# Patient Record
Sex: Male | Born: 1945 | Race: White | Hispanic: No | Marital: Married
Health system: Southern US, Community
[De-identification: ages and names within clinical notes are randomized; demographics above are authoritative.]

---

## 2006-07-30 ENCOUNTER — Other Ambulatory Visit: Admission: RE | Admit: 2006-07-30 | Discharge: 2006-07-30 | Payer: Self-pay | Admitting: Otolaryngology

## 2006-08-09 ENCOUNTER — Ambulatory Visit (HOSPITAL_BASED_OUTPATIENT_CLINIC_OR_DEPARTMENT_OTHER): Admission: RE | Admit: 2006-08-09 | Discharge: 2006-08-09 | Payer: Self-pay | Admitting: Otolaryngology

## 2006-08-09 ENCOUNTER — Encounter (INDEPENDENT_AMBULATORY_CARE_PROVIDER_SITE_OTHER): Payer: Self-pay | Admitting: Otolaryngology

## 2006-08-09 ENCOUNTER — Encounter (INDEPENDENT_AMBULATORY_CARE_PROVIDER_SITE_OTHER): Payer: Self-pay | Admitting: Specialist

## 2006-08-15 ENCOUNTER — Ambulatory Visit: Payer: Self-pay | Admitting: Hematology & Oncology

## 2006-08-30 LAB — CBC WITH DIFFERENTIAL/PLATELET
Basophils Absolute: 0.1 10*3/uL (ref 0.0–0.1)
HCT: 41.9 % (ref 38.7–49.9)
HGB: 14.5 g/dL (ref 13.0–17.1)
MONO#: 0.6 10*3/uL (ref 0.1–0.9)
NEUT%: 62.9 % (ref 40.0–75.0)
WBC: 6.6 10*3/uL (ref 4.0–10.0)
lymph#: 1.7 10*3/uL (ref 0.9–3.3)

## 2006-09-03 LAB — PROTEIN ELECTROPHORESIS, SERUM
Alpha-2-Globulin: 7.5 % (ref 7.1–11.8)
Gamma Globulin: 15.5 % (ref 11.1–18.8)

## 2006-09-03 LAB — COMPREHENSIVE METABOLIC PANEL
ALT: 18 U/L (ref 0–53)
BUN: 22 mg/dL (ref 6–23)
CO2: 27 mEq/L (ref 19–32)
Calcium: 10.5 mg/dL (ref 8.4–10.5)
Chloride: 102 mEq/L (ref 96–112)
Creatinine, Ser: 1.26 mg/dL (ref 0.40–1.50)
Glucose, Bld: 84 mg/dL (ref 70–99)

## 2006-09-03 LAB — IGG, IGA, IGM
IgA: 72 mg/dL (ref 68–378)
IgM, Serum: 45 mg/dL — ABNORMAL LOW (ref 60–263)

## 2006-09-03 LAB — LACTATE DEHYDROGENASE: LDH: 128 U/L (ref 94–250)

## 2006-09-16 ENCOUNTER — Encounter (INDEPENDENT_AMBULATORY_CARE_PROVIDER_SITE_OTHER): Payer: Self-pay | Admitting: *Deleted

## 2006-09-16 ENCOUNTER — Ambulatory Visit (HOSPITAL_COMMUNITY): Admission: RE | Admit: 2006-09-16 | Discharge: 2006-09-16 | Payer: Self-pay | Admitting: Hematology & Oncology

## 2006-09-16 ENCOUNTER — Encounter: Payer: Self-pay | Admitting: Hematology & Oncology

## 2006-09-19 ENCOUNTER — Ambulatory Visit: Payer: Self-pay | Admitting: Hematology & Oncology

## 2006-09-19 ENCOUNTER — Ambulatory Visit (HOSPITAL_COMMUNITY): Admission: RE | Admit: 2006-09-19 | Discharge: 2006-09-19 | Payer: Self-pay | Admitting: Hematology & Oncology

## 2006-10-03 ENCOUNTER — Ambulatory Visit: Payer: Self-pay | Admitting: Hematology & Oncology

## 2006-11-04 LAB — COMPREHENSIVE METABOLIC PANEL
ALT: 27 U/L (ref 0–53)
Alkaline Phosphatase: 80 U/L (ref 39–117)
Sodium: 139 mEq/L (ref 135–145)
Total Bilirubin: 1.1 mg/dL (ref 0.3–1.2)
Total Protein: 7.5 g/dL (ref 6.0–8.3)

## 2006-11-04 LAB — CBC WITH DIFFERENTIAL/PLATELET
BASO%: 2.6 % — ABNORMAL HIGH (ref 0.0–2.0)
EOS%: 3.9 % (ref 0.0–7.0)
LYMPH%: 16.5 % (ref 14.0–48.0)
MCH: 30.8 pg (ref 28.0–33.4)
MCHC: 36.4 g/dL — ABNORMAL HIGH (ref 32.0–35.9)
MCV: 84.5 fL (ref 81.6–98.0)
MONO%: 20 % — ABNORMAL HIGH (ref 0.0–13.0)
Platelets: 209 10*3/uL (ref 145–400)
RBC: 4.84 10*6/uL (ref 4.20–5.71)
WBC: 4 10*3/uL (ref 4.0–10.0)

## 2006-11-25 ENCOUNTER — Ambulatory Visit (HOSPITAL_COMMUNITY): Admission: RE | Admit: 2006-11-25 | Discharge: 2006-11-25 | Payer: Self-pay | Admitting: Hematology & Oncology

## 2006-11-27 ENCOUNTER — Ambulatory Visit: Payer: Self-pay | Admitting: Hematology & Oncology

## 2006-12-11 LAB — CBC WITH DIFFERENTIAL/PLATELET
BASO%: 0.8 % (ref 0.0–2.0)
Basophils Absolute: 0 10*3/uL (ref 0.0–0.1)
EOS%: 1.4 % (ref 0.0–7.0)
HCT: 38.6 % — ABNORMAL LOW (ref 38.7–49.9)
HGB: 13.9 g/dL (ref 13.0–17.1)
LYMPH%: 49.2 % — ABNORMAL HIGH (ref 14.0–48.0)
MCH: 31.6 pg (ref 28.0–33.4)
MCHC: 35.9 g/dL (ref 32.0–35.9)
MONO#: 1 10*3/uL — ABNORMAL HIGH (ref 0.1–0.9)
NEUT%: 30.1 % — ABNORMAL LOW (ref 40.0–75.0)
Platelets: 204 10*3/uL (ref 145–400)

## 2007-01-01 ENCOUNTER — Ambulatory Visit: Payer: Self-pay | Admitting: Hematology & Oncology

## 2007-01-01 LAB — CBC WITH DIFFERENTIAL/PLATELET
Basophils Absolute: 0 10*3/uL (ref 0.0–0.1)
EOS%: 2 % (ref 0.0–7.0)
Eosinophils Absolute: 0.1 10*3/uL (ref 0.0–0.5)
LYMPH%: 24.9 % (ref 14.0–48.0)
MCH: 31.4 pg (ref 28.0–33.4)
MCV: 87.8 fL (ref 81.6–98.0)
MONO%: 19.4 % — ABNORMAL HIGH (ref 0.0–13.0)
NEUT#: 2.2 10*3/uL (ref 1.5–6.5)
Platelets: 147 10*3/uL (ref 145–400)
RBC: 4.23 10*6/uL (ref 4.20–5.71)
RDW: 15.6 % — ABNORMAL HIGH (ref 11.2–14.6)

## 2007-01-28 LAB — CBC WITH DIFFERENTIAL/PLATELET
BASO%: 1.3 % (ref 0.0–2.0)
LYMPH%: 12.8 % — ABNORMAL LOW (ref 14.0–48.0)
MCHC: 36.2 g/dL — ABNORMAL HIGH (ref 32.0–35.9)
MCV: 88.8 fL (ref 81.6–98.0)
MONO%: 19.3 % — ABNORMAL HIGH (ref 0.0–13.0)
NEUT#: 2.4 10*3/uL (ref 1.5–6.5)
Platelets: 156 10*3/uL (ref 145–400)
RBC: 4.3 10*6/uL (ref 4.20–5.71)
RDW: 15.8 % — ABNORMAL HIGH (ref 11.2–14.6)
WBC: 3.8 10*3/uL — ABNORMAL LOW (ref 4.0–10.0)

## 2007-01-28 LAB — COMPREHENSIVE METABOLIC PANEL
ALT: 32 U/L (ref 0–53)
AST: 24 U/L (ref 0–37)
Albumin: 4.5 g/dL (ref 3.5–5.2)
Alkaline Phosphatase: 80 U/L (ref 39–117)
Potassium: 4.6 mEq/L (ref 3.5–5.3)
Sodium: 141 mEq/L (ref 135–145)
Total Bilirubin: 0.8 mg/dL (ref 0.3–1.2)
Total Protein: 7.1 g/dL (ref 6.0–8.3)

## 2007-02-21 ENCOUNTER — Ambulatory Visit: Payer: Self-pay | Admitting: Hematology & Oncology

## 2007-02-25 LAB — COMPREHENSIVE METABOLIC PANEL
AST: 20 U/L (ref 0–37)
Alkaline Phosphatase: 69 U/L (ref 39–117)
Glucose, Bld: 94 mg/dL (ref 70–99)
Sodium: 138 mEq/L (ref 135–145)
Total Bilirubin: 0.6 mg/dL (ref 0.3–1.2)
Total Protein: 7 g/dL (ref 6.0–8.3)

## 2007-02-25 LAB — CBC WITH DIFFERENTIAL/PLATELET
BASO%: 1.5 % (ref 0.0–2.0)
EOS%: 2 % (ref 0.0–7.0)
Eosinophils Absolute: 0.1 10*3/uL (ref 0.0–0.5)
LYMPH%: 30.5 % (ref 14.0–48.0)
MCH: 31.3 pg (ref 28.0–33.4)
MCHC: 36.2 g/dL — ABNORMAL HIGH (ref 32.0–35.9)
MCV: 86.3 fL (ref 81.6–98.0)
MONO%: 20.5 % — ABNORMAL HIGH (ref 0.0–13.0)
Platelets: 141 10*3/uL — ABNORMAL LOW (ref 145–400)
RBC: 4.19 10*6/uL — ABNORMAL LOW (ref 4.20–5.71)
RDW: 13.1 % (ref 11.2–14.6)

## 2007-04-01 ENCOUNTER — Encounter: Payer: Self-pay | Admitting: Hematology & Oncology

## 2007-04-01 ENCOUNTER — Ambulatory Visit: Payer: Self-pay | Admitting: Hematology & Oncology

## 2007-04-01 ENCOUNTER — Ambulatory Visit (HOSPITAL_COMMUNITY): Admission: RE | Admit: 2007-04-01 | Discharge: 2007-04-01 | Payer: Self-pay | Admitting: Hematology & Oncology

## 2007-04-07 ENCOUNTER — Ambulatory Visit: Payer: Self-pay | Admitting: Hematology & Oncology

## 2007-04-08 ENCOUNTER — Ambulatory Visit (HOSPITAL_COMMUNITY): Admission: RE | Admit: 2007-04-08 | Discharge: 2007-04-08 | Payer: Self-pay | Admitting: Hematology & Oncology

## 2007-04-09 LAB — CBC WITH DIFFERENTIAL/PLATELET
Basophils Absolute: 0 10*3/uL (ref 0.0–0.1)
EOS%: 2.1 % (ref 0.0–7.0)
Eosinophils Absolute: 0.1 10*3/uL (ref 0.0–0.5)
HCT: 33.7 % — ABNORMAL LOW (ref 38.7–49.9)
HGB: 12.2 g/dL — ABNORMAL LOW (ref 13.0–17.1)
MCH: 33.2 pg (ref 28.0–33.4)
MCV: 92.2 fL (ref 81.6–98.0)
NEUT#: 1.6 10*3/uL (ref 1.5–6.5)
NEUT%: 47.5 % (ref 40.0–75.0)
lymph#: 1.1 10*3/uL (ref 0.9–3.3)

## 2007-04-09 LAB — COMPREHENSIVE METABOLIC PANEL
AST: 17 U/L (ref 0–37)
Albumin: 4.4 g/dL (ref 3.5–5.2)
BUN: 24 mg/dL — ABNORMAL HIGH (ref 6–23)
Calcium: 9.9 mg/dL (ref 8.4–10.5)
Chloride: 104 mEq/L (ref 96–112)
Creatinine, Ser: 1.24 mg/dL (ref 0.40–1.50)
Glucose, Bld: 145 mg/dL — ABNORMAL HIGH (ref 70–99)
Potassium: 4.8 mEq/L (ref 3.5–5.3)

## 2007-04-09 LAB — LACTATE DEHYDROGENASE: LDH: 167 U/L (ref 94–250)

## 2007-06-06 ENCOUNTER — Ambulatory Visit: Payer: Self-pay | Admitting: Hematology & Oncology

## 2007-06-10 LAB — COMPREHENSIVE METABOLIC PANEL
AST: 18 U/L (ref 0–37)
Albumin: 4.2 g/dL (ref 3.5–5.2)
Alkaline Phosphatase: 74 U/L (ref 39–117)
Potassium: 4.4 mEq/L (ref 3.5–5.3)
Sodium: 140 mEq/L (ref 135–145)
Total Bilirubin: 0.8 mg/dL (ref 0.3–1.2)
Total Protein: 6.6 g/dL (ref 6.0–8.3)

## 2007-06-10 LAB — CBC WITH DIFFERENTIAL/PLATELET
Basophils Absolute: 0 10*3/uL (ref 0.0–0.1)
Eosinophils Absolute: 0.1 10*3/uL (ref 0.0–0.5)
HGB: 11 g/dL — ABNORMAL LOW (ref 13.0–17.1)
MCV: 94.6 fL (ref 81.6–98.0)
MONO#: 0.4 10*3/uL (ref 0.1–0.9)
MONO%: 15.8 % — ABNORMAL HIGH (ref 0.0–13.0)
NEUT#: 1.4 10*3/uL — ABNORMAL LOW (ref 1.5–6.5)
RBC: 3.21 10*6/uL — ABNORMAL LOW (ref 4.20–5.71)
RDW: 16 % — ABNORMAL HIGH (ref 11.2–14.6)
WBC: 2.5 10*3/uL — ABNORMAL LOW (ref 4.0–10.0)
lymph#: 0.6 10*3/uL — ABNORMAL LOW (ref 0.9–3.3)

## 2007-07-31 ENCOUNTER — Ambulatory Visit (HOSPITAL_COMMUNITY): Admission: RE | Admit: 2007-07-31 | Discharge: 2007-07-31 | Payer: Self-pay | Admitting: Hematology & Oncology

## 2007-08-01 ENCOUNTER — Ambulatory Visit: Payer: Self-pay | Admitting: Hematology & Oncology

## 2007-08-05 LAB — COMPREHENSIVE METABOLIC PANEL
AST: 16 U/L (ref 0–37)
BUN: 18 mg/dL (ref 6–23)
CO2: 26 mEq/L (ref 19–32)
Calcium: 10.1 mg/dL (ref 8.4–10.5)
Chloride: 104 mEq/L (ref 96–112)
Creatinine, Ser: 1.19 mg/dL (ref 0.40–1.50)

## 2007-08-05 LAB — CBC & DIFF AND RETIC
BASO%: 1.2 % (ref 0.0–2.0)
Basophils Absolute: 0 10*3/uL (ref 0.0–0.1)
EOS%: 3.6 % (ref 0.0–7.0)
Eosinophils Absolute: 0.1 10*3/uL (ref 0.0–0.5)
HCT: 31.3 % — ABNORMAL LOW (ref 38.7–49.9)
HGB: 11.2 g/dL — ABNORMAL LOW (ref 13.0–17.1)
IRF: 0.42 — ABNORMAL HIGH (ref 0.070–0.380)
LYMPH%: 28.5 % (ref 14.0–48.0)
MCH: 34.5 pg — ABNORMAL HIGH (ref 28.0–33.4)
MCHC: 36 g/dL — ABNORMAL HIGH (ref 32.0–35.9)
MCV: 96 fL (ref 81.6–98.0)
MONO#: 0.7 10*3/uL (ref 0.1–0.9)
MONO%: 19.9 % — ABNORMAL HIGH (ref 0.0–13.0)
NEUT#: 1.6 10*3/uL (ref 1.5–6.5)
NEUT%: 46.8 % (ref 40.0–75.0)
Platelets: 77 10*3/uL — ABNORMAL LOW (ref 145–400)
RBC: 3.26 10*6/uL — ABNORMAL LOW (ref 4.20–5.71)
RDW: 14.5 % (ref 11.2–14.6)
RETIC #: 85.1 10*3/uL (ref 31.8–103.9)
Retic %: 2.6 % — ABNORMAL HIGH (ref 0.7–2.3)
WBC: 3.5 10*3/uL — ABNORMAL LOW (ref 4.0–10.0)
lymph#: 1 10*3/uL (ref 0.9–3.3)

## 2007-08-05 LAB — LACTATE DEHYDROGENASE: LDH: 182 U/L (ref 94–250)

## 2007-08-05 LAB — CHCC SMEAR

## 2007-10-20 ENCOUNTER — Ambulatory Visit (HOSPITAL_COMMUNITY): Admission: RE | Admit: 2007-10-20 | Discharge: 2007-10-20 | Payer: Self-pay | Admitting: Hematology & Oncology

## 2007-10-30 ENCOUNTER — Ambulatory Visit: Payer: Self-pay | Admitting: Hematology & Oncology

## 2007-11-04 LAB — CBC WITH DIFFERENTIAL/PLATELET
BASO%: 2.4 % — ABNORMAL HIGH (ref 0.0–2.0)
Eosinophils Absolute: 0.1 10*3/uL (ref 0.0–0.5)
LYMPH%: 26.8 % (ref 14.0–48.0)
MCHC: 36 g/dL — ABNORMAL HIGH (ref 32.0–35.9)
MONO#: 0.5 10*3/uL (ref 0.1–0.9)
NEUT#: 2.5 10*3/uL (ref 1.5–6.5)
Platelets: 77 10*3/uL — ABNORMAL LOW (ref 145–400)
RBC: 3.83 10*6/uL — ABNORMAL LOW (ref 4.20–5.71)
RDW: 13.3 % (ref 11.2–14.6)
WBC: 4.4 10*3/uL (ref 4.0–10.0)
lymph#: 1.2 10*3/uL (ref 0.9–3.3)

## 2007-11-04 LAB — COMPREHENSIVE METABOLIC PANEL
ALT: 17 U/L (ref 0–53)
CO2: 26 mEq/L (ref 19–32)
Calcium: 9.8 mg/dL (ref 8.4–10.5)
Chloride: 106 mEq/L (ref 96–112)
Glucose, Bld: 87 mg/dL (ref 70–99)
Sodium: 138 mEq/L (ref 135–145)
Total Bilirubin: 0.9 mg/dL (ref 0.3–1.2)
Total Protein: 6.5 g/dL (ref 6.0–8.3)

## 2007-11-04 LAB — LACTATE DEHYDROGENASE: LDH: 152 U/L (ref 94–250)

## 2007-11-04 LAB — CHCC SMEAR

## 2008-01-30 ENCOUNTER — Ambulatory Visit: Payer: Self-pay | Admitting: Hematology & Oncology

## 2008-02-03 ENCOUNTER — Ambulatory Visit: Payer: Self-pay | Admitting: Hematology & Oncology

## 2008-02-04 LAB — CBC WITH DIFFERENTIAL (CANCER CENTER ONLY)
BASO#: 0 10*3/uL (ref 0.0–0.2)
Eosinophils Absolute: 0.1 10*3/uL (ref 0.0–0.5)
HGB: 13.2 g/dL (ref 13.0–17.1)
LYMPH#: 1.1 10*3/uL (ref 0.9–3.3)
MCH: 32.9 pg (ref 28.0–33.4)
MONO%: 15.5 % — ABNORMAL HIGH (ref 0.0–13.0)
NEUT#: 1.8 10*3/uL (ref 1.5–6.5)
Platelets: 90 10*3/uL — ABNORMAL LOW (ref 145–400)
RBC: 4.01 10*6/uL — ABNORMAL LOW (ref 4.20–5.70)
WBC: 3.6 10*3/uL — ABNORMAL LOW (ref 4.0–10.0)

## 2008-02-04 LAB — COMPREHENSIVE METABOLIC PANEL
Albumin: 4.2 g/dL (ref 3.5–5.2)
BUN: 21 mg/dL (ref 6–23)
CO2: 22 mEq/L (ref 19–32)
Calcium: 9.9 mg/dL (ref 8.4–10.5)
Chloride: 105 mEq/L (ref 96–112)
Creatinine, Ser: 1.18 mg/dL (ref 0.40–1.50)
Glucose, Bld: 109 mg/dL — ABNORMAL HIGH (ref 70–99)
Potassium: 4.6 mEq/L (ref 3.5–5.3)

## 2008-02-04 LAB — LACTATE DEHYDROGENASE: LDH: 194 U/L (ref 94–250)

## 2008-02-04 LAB — CHCC SATELLITE - SMEAR

## 2008-04-07 ENCOUNTER — Ambulatory Visit (HOSPITAL_COMMUNITY): Admission: RE | Admit: 2008-04-07 | Discharge: 2008-04-07 | Payer: Self-pay | Admitting: Hematology & Oncology

## 2008-05-04 ENCOUNTER — Ambulatory Visit: Payer: Self-pay | Admitting: Hematology & Oncology

## 2008-05-05 LAB — COMPREHENSIVE METABOLIC PANEL
ALT: 22 U/L (ref 0–53)
Alkaline Phosphatase: 68 U/L (ref 39–117)
BUN: 24 mg/dL — ABNORMAL HIGH (ref 6–23)
CO2: 24 mEq/L (ref 19–32)
Creatinine, Ser: 1.28 mg/dL (ref 0.40–1.50)
Potassium: 4.5 mEq/L (ref 3.5–5.3)

## 2008-05-05 LAB — CBC WITH DIFFERENTIAL (CANCER CENTER ONLY)
BASO%: 0.7 % (ref 0.0–2.0)
EOS%: 3.8 % (ref 0.0–7.0)
HGB: 13.1 g/dL (ref 13.0–17.1)
LYMPH#: 1 10*3/uL (ref 0.9–3.3)
MCH: 32.9 pg (ref 28.0–33.4)
MCHC: 34.1 g/dL (ref 32.0–35.9)
MONO%: 25.7 % — ABNORMAL HIGH (ref 0.0–13.0)
NEUT#: 1.3 10*3/uL — ABNORMAL LOW (ref 1.5–6.5)
Platelets: 97 10*3/uL — ABNORMAL LOW (ref 145–400)
RDW: 12.4 % (ref 10.5–14.6)

## 2008-09-01 ENCOUNTER — Ambulatory Visit: Payer: Self-pay | Admitting: Hematology & Oncology

## 2008-09-02 LAB — CBC WITH DIFFERENTIAL (CANCER CENTER ONLY)
BASO#: 0 10*3/uL (ref 0.0–0.2)
BASO%: 0.5 % (ref 0.0–2.0)
EOS%: 1.6 % (ref 0.0–7.0)
HCT: 38.7 % (ref 38.7–49.9)
HGB: 13 g/dL (ref 13.0–17.1)
LYMPH#: 1.4 10*3/uL (ref 0.9–3.3)
MCH: 32.1 pg (ref 28.0–33.4)
MCHC: 33.5 g/dL (ref 32.0–35.9)
MONO%: 22.2 % — ABNORMAL HIGH (ref 0.0–13.0)
NEUT%: 39.3 % — ABNORMAL LOW (ref 40.0–80.0)
RDW: 12.6 % (ref 10.5–14.6)

## 2008-09-02 LAB — COMPREHENSIVE METABOLIC PANEL
ALT: 17 U/L (ref 0–53)
Alkaline Phosphatase: 83 U/L (ref 39–117)
CO2: 27 mEq/L (ref 19–32)
Creatinine, Ser: 1.27 mg/dL (ref 0.40–1.50)
Potassium: 4.4 mEq/L (ref 3.5–5.3)
Sodium: 139 mEq/L (ref 135–145)
Total Bilirubin: 0.6 mg/dL (ref 0.3–1.2)

## 2008-11-05 IMAGING — PT NM PET TUM IMG SKULL BASE T - THIGH
7 series · 25 of 25 positions shown · non-contrast
Comparison: none

CLINICAL DATA: Newly diagnosed lymphoma.  Staging.
FDG PET-CT TUMOR IMAGING (SKULL BASE TO THIGHS):
Fasting Blood Glucose:  97
TECHNIQUE: 15.5 mCi F18-FDG were administered via the right arm.  Full ring PET imaging was performed from the skull base through the mid-thighs 60 minutes after injection.  CT data was obtained and used for attenuation correction and anatomic localization only.  (This was not acquired as a diagnostic CT examination.)

[Series 1: pet ac · axial · 3.3mm · 4.69mm/px · z∈[-1023,-9]mm · 6 of 311 slices shown]
[im 1/311]
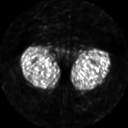
[im 63/311]
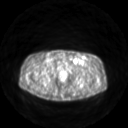
[im 125/311]
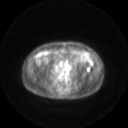
[im 187/311]
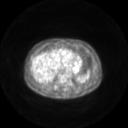
[im 249/311]
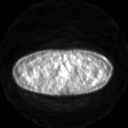
[im 311/311]
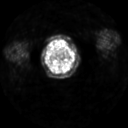

[Series 2: pet nac · axial · 3.3mm · 4.69mm/px · z∈[-1023,-9]mm · 6 of 311 slices shown]
[im 1/311]
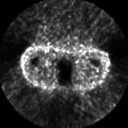
[im 63/311]
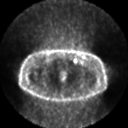
[im 125/311]
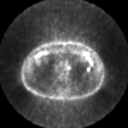
[im 187/311]
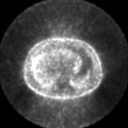
[im 249/311]
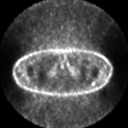
[im 311/311]
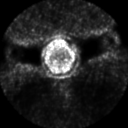

[Series 2: ct images · axial · 3.8mm · 0.98mm/px · z∈[-1023,-10]mm · 5 of 307 slices shown]
[im 1/307]
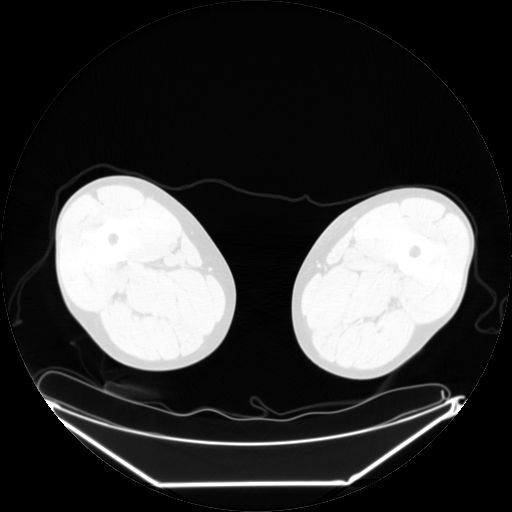
[im 77/307]
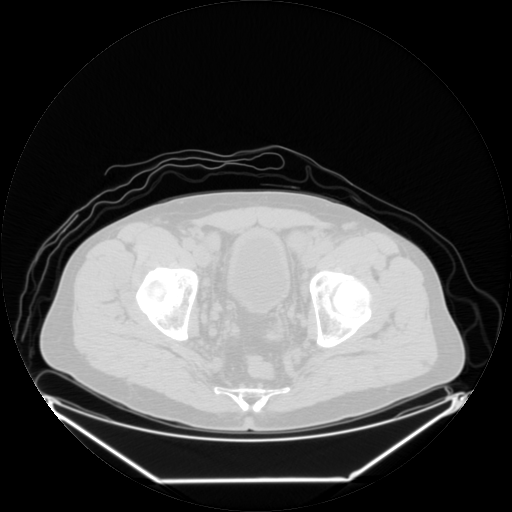
[im 154/307]
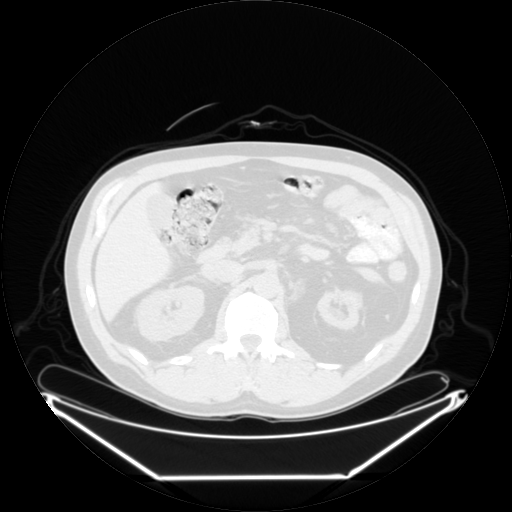
[im 230/307]
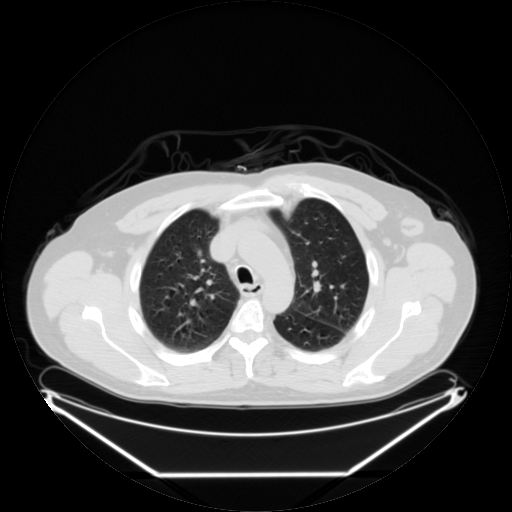
[im 307/307  brain]
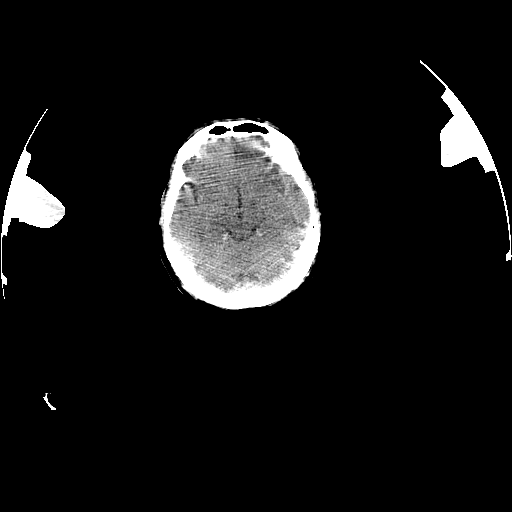

[Series 123: mip · coronal · 3.3mm · 4.69mm/px · 1 of 30 slices shown]
[im 1/30]
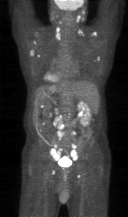

[Series 152: reformatted · axial · 3.3mm · 3.91mm/px · z∈[-1023,-9]mm · 5 of 308 slices shown (1 of 3)]
[im 1/308]
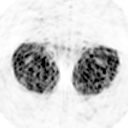
[im 77/308]
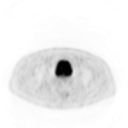
[im 154/308]
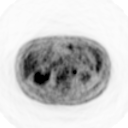
[im 231/308]
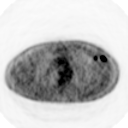
[im 308/308]
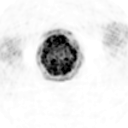

[Series 154: reformatted · coronal · 4.7mm · 8.14mm/px · 1 of 69 slices shown (2 of 3)]
[im 1/69]
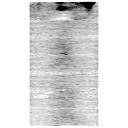

[Series 155: reformatted · coronal · 4.7mm · 8.14mm/px · 1 of 69 slices shown (3 of 3)]
[im 1/69]
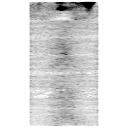

[25 of 25 positions shown; findings below may reference images not displayed]

FINDINGS: FDG uptake is seen within multiple areas of adenopathy within the neck bilaterally, both axillary regions, retroperitoneum, small bowel mesentery, soft tissue nodules in the right perinephric space, and pelvis in both iliac chains and inguinal regions.  This is consistent with the patient?s known lymphoma.  There is no evidence of skeletal involvement.
IMPRESSION: FDG uptake within multiple areas of adenopathy in the neck bilaterally, bilateral axilla, retroperitoneum including right perinephric space, and pelvis, consistent with known lymphoma.

## 2008-11-30 ENCOUNTER — Ambulatory Visit (HOSPITAL_COMMUNITY): Admission: RE | Admit: 2008-11-30 | Discharge: 2008-11-30 | Payer: Self-pay | Admitting: Hematology & Oncology

## 2009-01-05 ENCOUNTER — Ambulatory Visit: Payer: Self-pay | Admitting: Hematology & Oncology

## 2009-01-06 LAB — MANUAL DIFFERENTIAL (CHCC SATELLITE)
ALC: 0.9 10*3/uL (ref 0.9–3.3)
Eos: 1 % (ref 0–7)
PLT EST ~~LOC~~: DECREASED
SEG: 63 % (ref 40–75)

## 2009-01-06 LAB — COMPREHENSIVE METABOLIC PANEL
ALT: 26 U/L (ref 0–53)
AST: 23 U/L (ref 0–37)
Albumin: 4.6 g/dL (ref 3.5–5.2)
CO2: 26 mEq/L (ref 19–32)
Calcium: 10.7 mg/dL — ABNORMAL HIGH (ref 8.4–10.5)
Glucose, Bld: 96 mg/dL (ref 70–99)
Potassium: 4.2 mEq/L (ref 3.5–5.3)
Total Bilirubin: 1 mg/dL (ref 0.3–1.2)

## 2009-01-06 LAB — CBC WITH DIFFERENTIAL (CANCER CENTER ONLY)
HCT: 38.5 % — ABNORMAL LOW (ref 38.7–49.9)
MCH: 32 pg (ref 28.0–33.4)
MCV: 94 fL (ref 82–98)
Platelets: 136 10*3/uL — ABNORMAL LOW (ref 145–400)
RBC: 4.08 10*6/uL — ABNORMAL LOW (ref 4.20–5.70)
RDW: 14.4 % (ref 10.5–14.6)

## 2009-01-06 LAB — LACTATE DEHYDROGENASE: LDH: 169 U/L (ref 94–250)

## 2009-02-25 ENCOUNTER — Encounter
Admission: RE | Admit: 2009-02-25 | Discharge: 2009-03-01 | Payer: Self-pay | Admitting: Physical Medicine & Rehabilitation

## 2009-03-01 ENCOUNTER — Ambulatory Visit: Payer: Self-pay | Admitting: Physical Medicine & Rehabilitation

## 2009-04-05 ENCOUNTER — Ambulatory Visit: Payer: Self-pay | Admitting: Hematology & Oncology

## 2010-09-11 LAB — GLUCOSE, CAPILLARY: Glucose-Capillary: 101 mg/dL — ABNORMAL HIGH (ref 70–99)

## 2010-10-17 NOTE — Op Note (Signed)
NAME:  [name], [name]              ACCOUNT NO.:  [id number]   MEDICAL RECORD NO.:  [id number]          PATIENT TYPE:  AMB   LOCATION:  [facility]                         FACILITY:  [facility]   PHYSICIAN:  Rose Phi. Myna Hidalgo, M.D. DATE OF BIRTH:  26-Nov-1945   DATE OF PROCEDURE:  04/01/2007  DATE OF DISCHARGE:                               OPERATIVE REPORT   NATURE OF PROCEDURE:  Left posterior crest bone marrow biopsy and  aspirate.   SUMMARY:  Brett Martin was brought to the short-stay unit for bone marrow  biopsy.  He had an IV placed to his right hand.  He was then placed onto  his right side.   He received a total of 5 mg of Versed and 25 mg of Demerol for sedation.   The left posterior iliac crest region was prepped and draped in sterile  fashion and 10 mL of 2% lidocaine was infiltrated under the skin down to  the periosteum.  A #11 scalpel was used to make an incision into the  skin.  Two bone marrow aspirates were obtained without difficulty.   A second incision was made into the skin.  A good bone marrow biopsy  core was obtained without difficulty.   The patient tolerated the procedure well.  There no complications.      Rose Phi. Myna Hidalgo, M.D.  Electronically Signed     PRE/MEDQ  D:  04/01/2007  T:  04/01/2007  Job:  [id number]

## 2011-03-14 LAB — DIFFERENTIAL
Basophils Absolute: 0
Basophils Relative: 0
Lymphocytes Relative: 25
Monocytes Absolute: 0.6
Neutro Abs: 2.1
Neutrophils Relative %: 58

## 2011-03-14 LAB — CBC
Hemoglobin: 11.4 — ABNORMAL LOW
RBC: 3.48 — ABNORMAL LOW
RDW: 17 — ABNORMAL HIGH

## 2011-03-14 LAB — CHROMOSOME ANALYSIS, BONE MARROW

## 2015-10-05 ENCOUNTER — Telehealth: Payer: Self-pay | Admitting: Hematology & Oncology

## 2015-10-05 NOTE — Telephone Encounter (Signed)
Fax records request to Dr. Gwenith DailyHaas [phone]([id number] release id)
